# Patient Record
Sex: Male | Born: 1945 | Race: White | Hispanic: No | Marital: Married | Smoking: Current some day smoker
Health system: Southern US, Community
[De-identification: ages and names within clinical notes are randomized; demographics above are authoritative.]

---

## 2005-03-31 ENCOUNTER — Emergency Department: Payer: Self-pay | Admitting: Unknown Physician Specialty

## 2005-03-31 ENCOUNTER — Other Ambulatory Visit: Payer: Self-pay

## 2008-09-03 ENCOUNTER — Ambulatory Visit: Payer: Self-pay | Admitting: Gastroenterology

## 2008-09-03 LAB — HM COLONOSCOPY

## 2011-11-03 ENCOUNTER — Inpatient Hospital Stay: Payer: Self-pay | Admitting: Internal Medicine

## 2011-11-03 LAB — COMPREHENSIVE METABOLIC PANEL
Albumin: 4.4 g/dL (ref 3.4–5.0)
Alkaline Phosphatase: 68 U/L (ref 50–136)
Anion Gap: 9 (ref 7–16)
Chloride: 106 mmol/L (ref 98–107)
Co2: 22 mmol/L (ref 21–32)
Creatinine: 0.94 mg/dL (ref 0.60–1.30)
EGFR (African American): >60
EGFR (Non-African Amer.): >60
Glucose: 131 mg/dL — ABNORMAL HIGH (ref 65–99)
Osmolality: 279 (ref 275–301)
Potassium: 5 mmol/L (ref 3.5–5.1)
SGPT (ALT): 33 U/L
Total Protein: 8.6 g/dL — ABNORMAL HIGH (ref 6.4–8.2)

## 2011-11-03 LAB — APTT
Activated PTT: 39.4 secs — ABNORMAL HIGH (ref 23.6–35.9)
Activated PTT: 111.9 secs — ABNORMAL HIGH (ref 23.6–35.9)

## 2011-11-03 LAB — CK TOTAL AND CKMB (NOT AT ARMC): CK-MB: 1.7 ng/mL (ref 0.5–3.6)

## 2011-11-03 LAB — CBC
MCHC: 32.5 g/dL (ref 32.0–36.0)
MCV: 95 fL (ref 80–100)
Platelet: 248 10*3/uL (ref 150–440)
WBC: 12.7 10*3/uL — ABNORMAL HIGH (ref 3.8–10.6)

## 2011-11-03 LAB — TROPONIN I
Troponin-I: 0.19 ng/mL — ABNORMAL HIGH
Troponin-I: 0.18 ng/mL — ABNORMAL HIGH

## 2011-11-03 LAB — PRO B NATRIURETIC PEPTIDE: B-Type Natriuretic Peptide: 5258 pg/mL — ABNORMAL HIGH (ref 0–125)

## 2011-11-03 LAB — PROTIME-INR: Prothrombin Time: 13.8 secs (ref 11.5–14.7)

## 2011-11-04 LAB — HEMOGLOBIN A1C: Hemoglobin A1C: 5.5 % (ref 4.2–6.3)

## 2011-11-04 LAB — CBC WITH DIFFERENTIAL/PLATELET
Basophil %: 0.4 %
Eosinophil #: 0.4 10*3/uL (ref 0.0–0.7)
Eosinophil %: 3.6 %
HCT: 47.2 % (ref 40.0–52.0)
HGB: 16.3 g/dL (ref 13.0–18.0)
Lymphocyte #: 1.7 10*3/uL (ref 1.0–3.6)
MCH: 31.9 pg (ref 26.0–34.0)
MCHC: 34.5 g/dL (ref 32.0–36.0)
MCV: 92 fL (ref 80–100)
Monocyte %: 10.1 %
Neutrophil %: 69.1 %
Platelet: 187 10*3/uL (ref 150–440)
RDW: 13.2 % (ref 11.5–14.5)
WBC: 9.9 10*3/uL (ref 3.8–10.6)

## 2011-11-04 LAB — BASIC METABOLIC PANEL
Anion Gap: 10 (ref 7–16)
BUN: 21 mg/dL — ABNORMAL HIGH (ref 7–18)
Calcium, Total: 8.8 mg/dL (ref 8.5–10.1)
Chloride: 100 mmol/L (ref 98–107)
Co2: 29 mmol/L (ref 21–32)
Creatinine: 1.06 mg/dL (ref 0.60–1.30)
EGFR (African American): >60
Glucose: 99 mg/dL (ref 65–99)
Osmolality: 281 (ref 275–301)
Sodium: 139 mmol/L (ref 136–145)

## 2011-11-04 LAB — CK TOTAL AND CKMB (NOT AT ARMC)
CK, Total: 49 U/L (ref 35–232)
CK-MB: 2 ng/mL (ref 0.5–3.6)

## 2011-11-04 LAB — MAGNESIUM: Magnesium: 1.5 mg/dL — ABNORMAL LOW

## 2011-11-04 LAB — TROPONIN I: Troponin-I: 0.32 ng/mL — ABNORMAL HIGH

## 2011-11-04 LAB — LIPID PANEL
Cholesterol: 183 mg/dL (ref 0–200)
Triglycerides: 185 mg/dL (ref 0–200)
VLDL Cholesterol, Calc: 37 mg/dL (ref 5–40)

## 2011-11-05 LAB — BASIC METABOLIC PANEL
Calcium, Total: 8.9 mg/dL (ref 8.5–10.1)
Chloride: 102 mmol/L (ref 98–107)
Co2: 27 mmol/L (ref 21–32)
Creatinine: 1.08 mg/dL (ref 0.60–1.30)
EGFR (African American): >60
Glucose: 101 mg/dL — ABNORMAL HIGH (ref 65–99)
Osmolality: 275 (ref 275–301)
Potassium: 3.8 mmol/L (ref 3.5–5.1)

## 2011-11-05 LAB — PRO B NATRIURETIC PEPTIDE: B-Type Natriuretic Peptide: 1121 pg/mL — ABNORMAL HIGH (ref 0–125)

## 2011-11-05 LAB — MAGNESIUM: Magnesium: 1.8 mg/dL

## 2011-12-11 ENCOUNTER — Ambulatory Visit: Payer: Self-pay | Admitting: Internal Medicine

## 2011-12-11 LAB — PROTIME-INR
INR: 2.6
Prothrombin Time: 28.3 secs — ABNORMAL HIGH (ref 11.5–14.7)

## 2012-08-08 ENCOUNTER — Ambulatory Visit: Payer: Self-pay | Admitting: Emergency Medicine

## 2013-06-05 ENCOUNTER — Emergency Department: Payer: Self-pay | Admitting: Emergency Medicine

## 2014-02-03 IMAGING — CR DG CHEST 2V
1 series · 2 of 2 positions shown · non-contrast
Comparison: none

REASON FOR EXAM: shob
COMMENTS:   May transport without cardiac monitor

PROCEDURE:     DXR - DXR CHEST PA (OR AP) AND LATERAL  - November 03, 2011 [DATE]
RESULT:     Comparison: None.

[Series 1: w chest pa · 0.14mm/px · 2 of 2 slices shown]
[im 1/2]
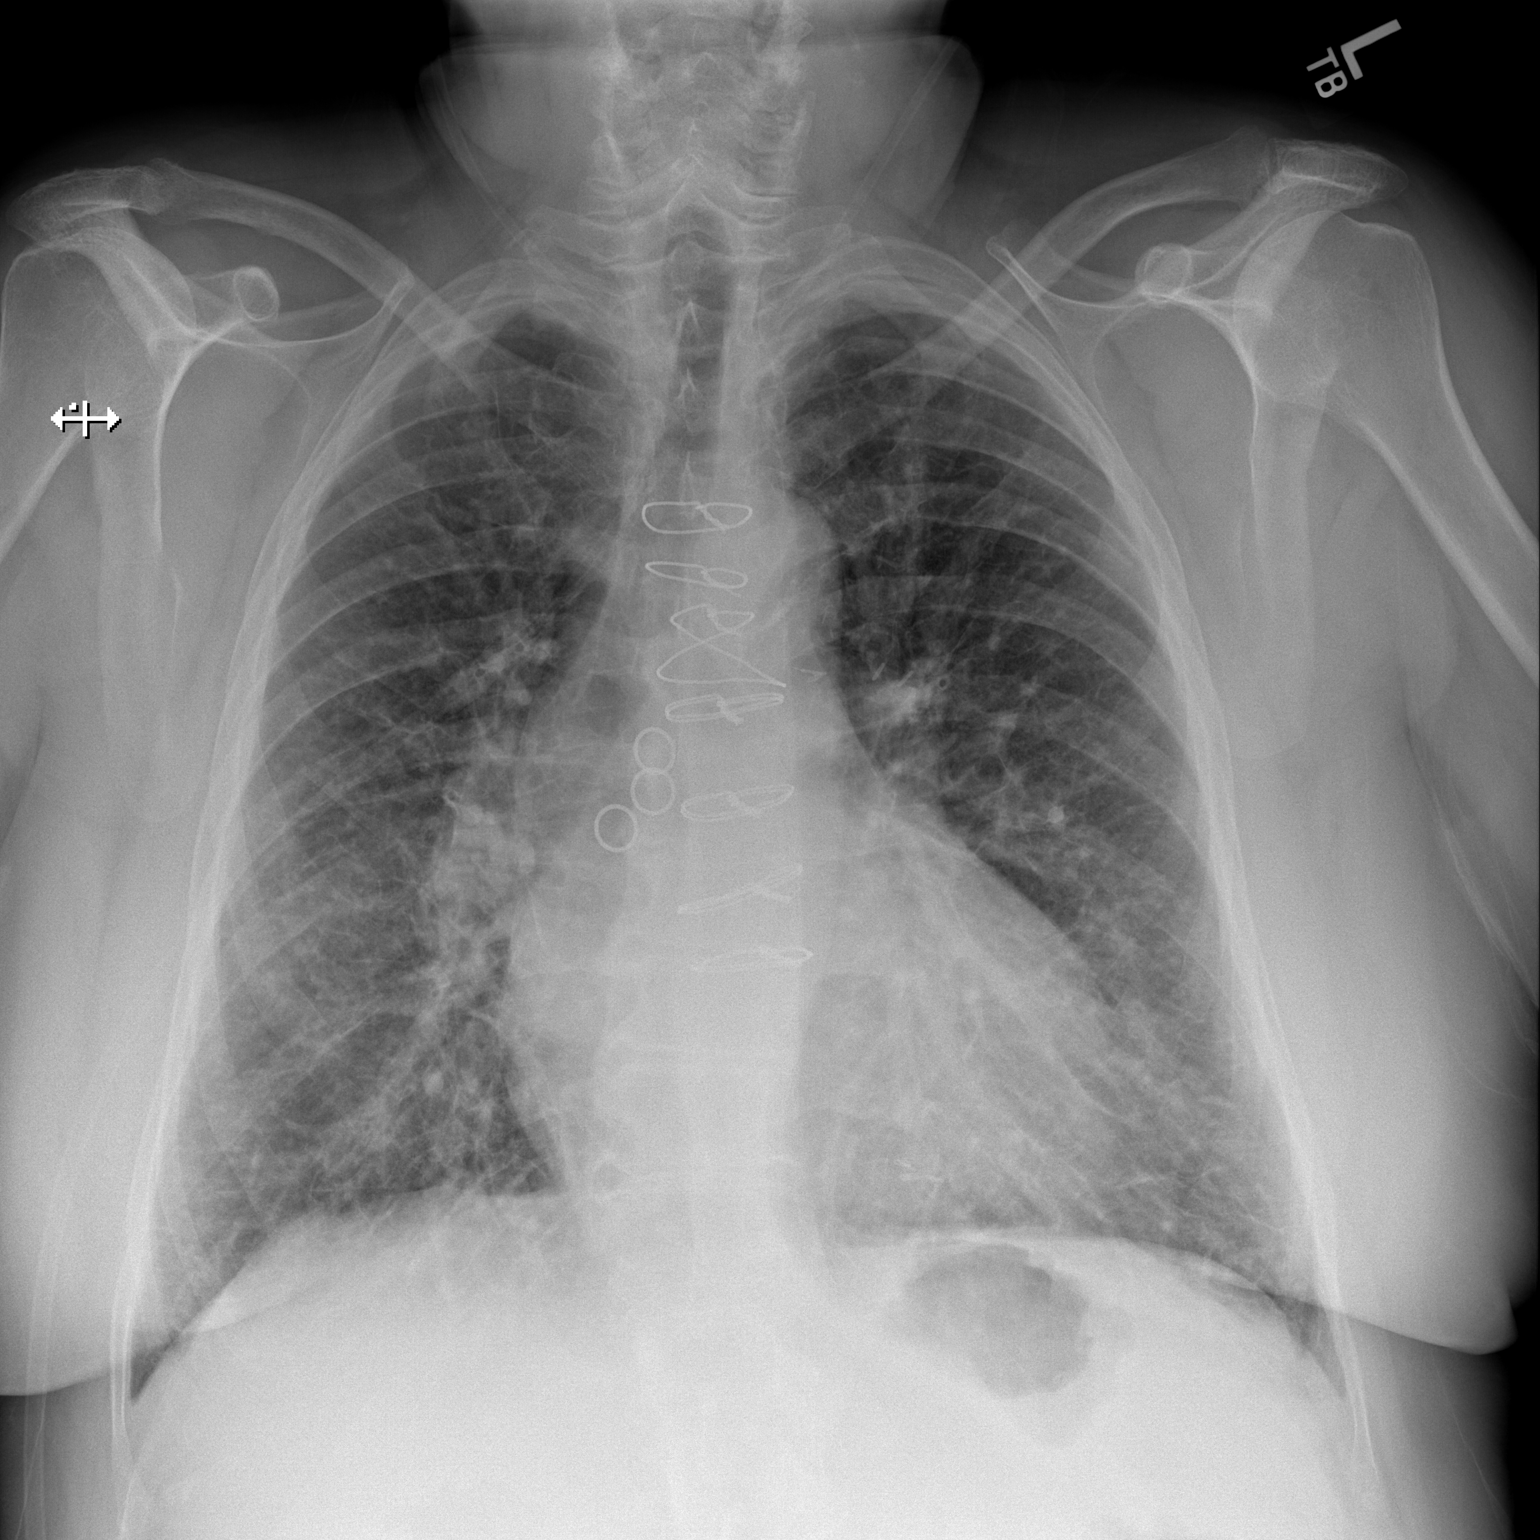
[im 2/2]
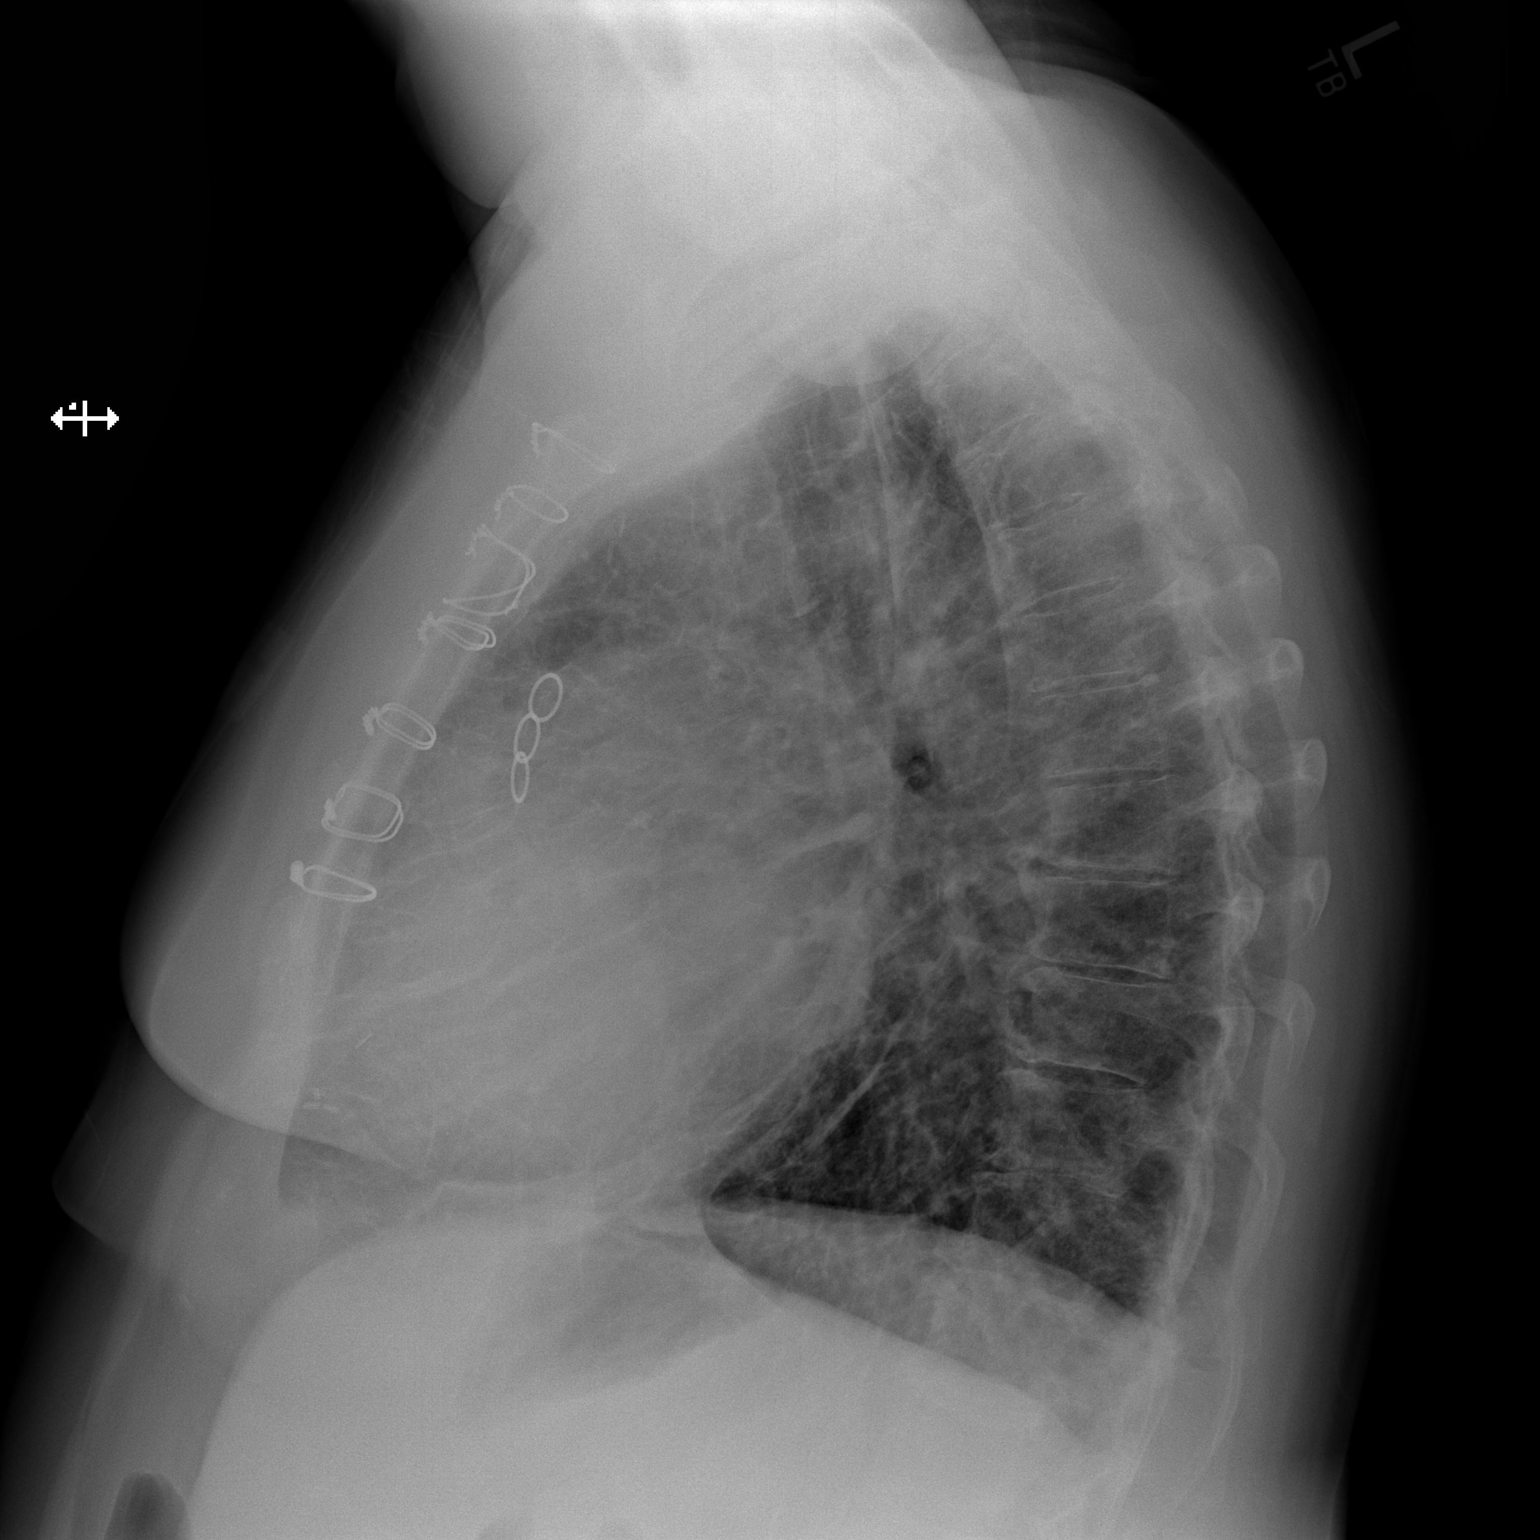

[2 of 2 positions shown; findings below may reference images not displayed]

FINDINGS: Heart size upper limits normal. Prior median sternotomy and CABG. There are
bilateral interstitial pulmonary opacities.
IMPRESSION: Nonspecific interstitial pulmonary opacities. Differential includes
interstitial pulmonary edema and atypical infection.

## 2014-11-01 NOTE — Discharge Summary (Signed)
PATIENT NAME:  Marc Luna, Marc Luna MR#:  [id number] DATE OF BIRTH:  02-Jul-1946  DATE OF ADMISSION:  11/03/2011 DATE OF DISCHARGE:  11/05/2011  ADMITTING DIAGNOSIS: Shortness of breath.   DISCHARGE DIAGNOSES:  1. Shortness of breath likely due to acute systolic congestive heart failure, now his shortness of breath has resolved.  2. Atrial fibrillation, which seems to be new onset. Patient started on Coumadin. He will have his initial INR checked at [facility] cardiology per Dr. Lady Gary and then he can have follow up with Dr. Elizabeth Sauer for further Coumadin management.  3. Intermediately elevated troponin, likely due to congestive heart failure. He will follow up with Dr. Gwen Pounds to decide whether he needs further cardiac catheterization for further evaluation. Patient has no chest pain.  4. Possible chronic obstructive pulmonary disease with wheezing noted on presentation. No further symptoms. Patient will need outpatient follow up with pulmonary and possible pulmonary function tests. Will continue inhalers as was taking at home.  5. Leukocytosis present on admission, possibly reactive. Patient has been afebrile.  6. Hyperglycemia, again reactive. His hemoglobin A1c is 5.7.  7. Hyperlipidemia.  8. Hypertension.  9. Nicotine addiction. Patient counseled by the admitting physician regarding importance of smoking.  10. History of depression.  11. Status post coronary artery bypass graft.   LABORATORY, DIAGNOSTIC AND RADIOLOGICAL DATA: BNP 5258. CPK 50, troponin 0.19, WBC 12.7, hemoglobin 18, hematocrit 55.2, platelet count 248, glucose 131, BUN 23. Chest x-ray showed nonspecific interstitial pulmonary opacities with concurrent pulmonary edema. Blood cultures x2 no growth at 36 hours. His troponin did increase to 0.32. Fasting lipid panel: Cholesterol 183, triglycerides 185, HDL 37, LDL 109. Hemoglobin A1c 5.5. Magnesium 1.5. WBC count on 04/27 was 9.9. Echocardiogram done 04/27 showed ejection  fraction less than 25%, moderate to severe global hypokinesis, right atrium is mildly enlarged, mild mitral regurgitation. Patient's creatinine today was 1.08.   CONSULTANT: Dr. Lady Gary.   HOSPITAL COURSE: Please refer to history and physical done by the admitting physician. Patient is a 69 year old white male with past medical history of coronary artery disease, status post CABG, depression, hypertension, hyperlipidemia, ongoing smoking who was in his usual state of health until the morning of admission when he started becoming very short of breath. He was having some dry cough for the past few days. Patient came to the ED and found to be in respiratory distress, had to be placed on oxygen. Patient also was wheezing initially. He was given IV Lasix with improvement in his symptoms. We were asked to admit the patient. Patient was diagnosed with acute congestive heart failure. An echocardiogram was obtained which confirmed that he had severe systolic dysfunction. Patient was kept on diuresis with significant improvement in his shortness of breath. He is currently not short of breath and is not requiring any O2. Patient was also seen in consultation by cardiology. They recommended continuing current management. Patient also was noted to be in atrial fibrillation which is likely new. His [place] score was 2 based on his hypertension and his congestive heart failure. Patient was given option of Pradaxa and Coumadin. He chose to do Coumadin due to cost issues. Initially he was heparinized. Patient also was noted to have elevated troponin which was intermediate due to decrease in his ejection fraction compared to 2008. Dr. Lady Gary will be talking to Dr. Gwen Pounds regarding further evaluation for ischemia. He does have a history of coronary artery bypass graft in the past. Patient also on presentation was noted to have some  wheezing. He was started on some inhalers and his wheezing has since resolved. Patient with his smoking  history does need to be referred to pulmonary for further evaluation and PFTs to make sure he does not have any lung disease. Currently he has been cleared by cardiology to be discharged home.   DISCHARGE INSTRUCTIONS: He is to weigh himself every day, preferably first thing in the morning after urinating and before eating. Call physician if you gain of more than 2 pounds in one day or more than 5 pounds in one week, for increased weight gain, tiredness, swelling in legs or feet or chest pressure call 911. Echocardiogram done during this hospitalization.   DISCHARGE MEDICATIONS:  1. Lisinopril 20 daily.   2. Metoprolol tartrate 100 mg, 1 tab p.o. b.i.d.  3. Trilipix 135 p.o. daily.  4. Pravastatin 80 mg daily.  5. Zoloft 100 daily.  6. Niaspan ER 500 daily.  7. Aspirin 81 mg 1 tab p.o. daily.  8. Coumadin 4 mg daily.  9. Symbicort 160/4.5 INH q.12.  10. Combivent metered-dose inhaler 2 to 4 puffs q.6 p.r.n. shortness of breath or wheezing.  11. Lasix 20 daily.   DIET: Low sodium, low fat.   ACTIVITY: As tolerated.   TIMEFRAME FOR FOLLOW UP: 1 to 2 weeks with Dr. Elizabeth Sauereanna Jones, Dr. Gwen PoundsKowalski this coming week. Patient to have his [facility] in [facility] per Dr. Lady GaryFath then with Dr. Elizabeth Sauereanna Jones for chronic anticoagulation due to his atrial fibrillation. Patient is also referred to Dr. Meredeth IdeFleming as a new patient for pulmonary evaluation for possible outpatient PFTs.   TIME SPENT: 35 minutes.   ____________________________ Lacie ScottsShreyang H. Allena KatzPatel, MD shp:cms D: 11/05/2011 11:51:52 ET T: 11/06/2011 11:34:07 ET JOB#: [id number]  cc: Ubah Radke H. Allena KatzPatel, MD, <Dictator> Duanne Limerickeanna C. Jones, MD Charise CarwinSHREYANG H Jaccob Czaplicki MD ELECTRONICALLY SIGNED 11/06/2011 13:17

## 2014-11-01 NOTE — H&P (Signed)
PATIENT NAME:  Marc Luna, Marc Luna MR#:  [id number] DATE OF BIRTH:  12/03/1945  DATE OF ADMISSION:  11/03/2011  REFERRING PHYSICIAN: ER physician, Dr. Mayford Knife  PRIMARY CARE PHYSICIAN: Dr. Elizabeth Sauer CARDIOLOGIST: Dr. Gwen Pounds  CHIEF COMPLAINT: Shortness of breath.   HISTORY OF PRESENT ILLNESS: Patient is a 69 year old male with past medical history of coronary artery disease, status post CABG, depression, hypertension, hyperlipidemia with ongoing smoking who was in his usual state of health until this morning when he became very short of breath. He has been having some cough without any expectoration for a few days. When he came to the ER he was found to be in some respiratory distress, had to be placed on oxygen. He was also wheezing. He was given some Lasix with improvement in his symptoms. Patient reports that he has not had any recent echo or stress test. He denies any fever, chest pain, lightheadedness, dizziness, leg swelling. He reports that he usually gains weight in the winter. Denies any sick contacts. He denies ever having history of irregular heart rhythm or atrial fibrillation. He was found to be in rate control atrial fibrillation.   ALLERGIES: Penicillin.   CURRENT MEDICATIONS:  1. Aspirin 81 mg b.i.d.  2. Lisinopril 20 mg daily.  3. Lopressor 100 mg b.i.d.  4. Niaspan 500 mg daily.  5. Pravachol 80 mg daily.  6. Trilipix 135 mg daily.  7. Zoloft 100 mg daily.   PAST MEDICAL HISTORY:  1. Coronary artery disease status post CABG. 2. Depression. 3. Hypertension. 4. Hyperlipidemia.   PAST SURGICAL HISTORY: Coronary artery bypass graft.   SOCIAL HISTORY: Smokes 1 pack per day. Denies any drug abuse. Drinks two beers at night, 8 to 9 beers on Saturday and 3 to 4 beers, 12 ounces, on Sunday. Denies ever having alcohol withdrawal. He is married and lives with his wife.   FAMILY HISTORY: Mother and mother's side of the family had heart disease. Father's side of the family had  cancer.    REVIEW OF SYSTEMS: CONSTITUTIONAL: Denies any fever, fatigue, weakness. EYES: Denies any blurred or double vision. ENT: Denies any tinnitus, ear pain. RESPIRATORY: Reports cough, dyspnea. CARDIOVASCULAR: Denies any chest pain, palpitations, syncope. GASTROINTESTINAL: Denies any nausea, vomiting, diarrhea, abdominal pain. GENITOURINARY: Denies any dysuria, hematuria. ENDOCRINE: Denies any polyuria, nocturia. HEME/LYMPH: Denies any anemia, easy bruisability. INTEGUMENTARY: Denies any acne, rash. MUSCULOSKELETAL: Denies any swelling, gout. NEUROLOGICAL: Denies any numbness, weakness. PSYCH: Denies any anxiety, nervousness.   PHYSICAL EXAMINATION:  VITAL SIGNS: Temperature 94.8, heart rate 92, respiratory rate 36, blood pressure 168/88, pulse oximetry 90%.   HEAD: Atraumatic, normocephalic.   EYES: There is no pallor, icterus, or cyanosis. Pupils are equal, round, and reactive to light and accommodation. Extraocular movements intact.   ENT: Wet mucous membranes. No oropharyngeal erythema or thrush.   NECK: Supple. No masses. No JVD. No thyromegaly. No lymphadenopathy.   CHEST WALL: No tenderness to palpation. Not using accessory muscles of respiration. No intercostal muscle retractions.   LUNGS: Bilateral basal crepitations. Patient had some wheezing initially which is now resolved.   CARDIOVASCULAR: S1, S2 irregularly irregular. There is a systolic murmur. No rubs or gallops.   ABDOMEN: Soft, nontender, nondistended. No guarding. No rigidity. Normal bowel sounds.   SKIN: No rashes, lesions.   PERIPHERIES: Trace pedal edema. 2+ pedal pulses.   MUSCULOSKELETAL: No cyanosis, clubbing.   NEUROLOGICAL: Awake, alert, oriented x3. Nonfocal neurological exam. Cranial nerves grossly intact.   PSYCH: Normal mood and affect.   LABORATORY,  DIAGNOSTIC AND RADIOLOGICAL DATA: BNP 5258. CK 50, troponin 0.19, white count 12.7, hemoglobin 18, hematocrit 55.2, platelets 248, glucose 131, BUN  23. Chest x-ray shows nonspecific interstitial pulmonary opacities. Additionally showed pulmonary edema versus atypical infection.   ASSESSMENT AND PLAN: 69 year old male with past medical history of coronary artery disease, depression, hypertension, hyperlipidemia presents with shortness of breath,  1. Possible congestive heart failure. Patient's chest x-ray shows pulmonary vascular congestion. His BNP is elevated at 5258. He had symptomatic relief with Lasix. Will get an echo to determine whether it is systolic or diastolic. Will continue IV Lasix with potassium supplementation to prevent hypokalemia.  2. Atrial fibrillation, possibly new onset. Patient denies having history of atrial fibrillation in the past. It is rate controlled possibly because he was on Lopressor. Will continue patient's beta blocker. He has a high [place] score and elevated troponin therefore will start him on anticoagulation with heparin. He will probably require oral anticoagulant at the time of discharge.  3. Elevated troponin. Differential diagnoses includes NSTEMI versus demand ischemia from atrial fibrillation and congestive heart failure. Patient denies any chest pain. Will check serial cardiac enzymes. Will continue with aspirin, beta blocker, ACE inhibitor, statin. Place on p.r.n. nitro paste and Lovenox. Will obtain a cardiology consultation and echo.  4. Possible pneumonitis. Chest x-ray shows possible pneumonitis/atypical infection. Patient has leukocytosis. Will obtain blood cultures and empirically treat with antibiotics.  5. Wheezing. Patient is a smoker. Denies any history of chronic obstructive pulmonary disease or bronchitis but was wheezing on admission. He may require outpatient PFTs. Will start on Symbicort, DuoNebs and Tussionex.  6. Hyperglycemia, possibly reactive. Will check a hemoglobin A1c.   7. Smoking. Patient has been counseled about cessation for more than three minutes. Will provide with a nicotine  patch. 8. Hyperlipidemia. Will continue current medications. Check a fasting lipid profile.  9. Hypertension. Appears to be stable at present.   Reviewed old medical records, discussed with the patient and his wife the plan of care and management.   TIME SPENT: 75 minutes.   ____________________________ Darrick MeigsSangeeta Deniese Oberry, MD sp:cms D: 11/03/2011 15:20:26 ET T: 11/03/2011 16:05:19 ET JOB#: [id number]  cc: Darrick MeigsSangeeta Amire Leazer, MD, <Dictator> Duanne Limerickeanna C. Jones, MD Darrick MeigsSANGEETA Ezrah Panning MD ELECTRONICALLY SIGNED 11/03/2011 17:22

## 2014-11-01 NOTE — Consult Note (Signed)
General Aspect patient is a 69 year old male with history of coronary artery disease status post coronary artery bypass grafting approximately 2007.  He is followed by Dr. Arnoldo HookerBruce Kowalski.  On day of admission he awoke complaining of severe shortness of breath.  He was brought to emergency room where he was noted to have probable mild pulmonary edema.  He states that prior to his coronary artery bypass grafting, he had similar feelings.  He underwent cardiac catheterization after ruling in for myocardial infarction and subsequently underwent coronary artery bypass grafting.  His initial serum troponin was unremarkable.  Echocardiogram done today reveals moderate-severely reduced left ventricular function with ejection fraction less than 25%.he is improved with diuresis and oxygen.  He currently denies any chest pain.   Physical Exam:   GEN no acute distress, obese    HEENT PERRL    NECK supple    RESP no use of accessory muscles  crackles    CARD Regular rate and rhythm  Murmur    Murmur Systolic    Systolic Murmur axilla    ABD denies tenderness  normal BS    LYMPH negative neck    EXTR negative cyanosis/clubbing, negative edema    SKIN normal to palpation    NEURO cranial nerves intact, motor/sensory function intact    PSYCH A+O to time, place, person   Review of Systems:   Subjective/Chief Complaint shortness of breath    General: Fatigue  Weakness    Skin: No Complaints    ENT: No Complaints    Eyes: No Complaints    Neck: No Complaints    Respiratory: Short of breath    Cardiovascular: No Complaints    Gastrointestinal: No Complaints    Genitourinary: No Complaints    Vascular: No Complaints    Musculoskeletal: No Complaints    Neurologic: No Complaints    Hematologic: No Complaints    Endocrine: No Complaints    Psychiatric: No Complaints    Review of Systems: All other systems were reviewed and found to be negative    Medications/Allergies  Reviewed Medications/Allergies reviewed     Myocardial Infarct:    Depression:    Hypertension:    CABG (Coronary Artery Bypass Graft):   Home Medications: Medication Instructions Status  lisinopril 20 mg oral tablet 1 tab(s) orally once a day Active  metoprolol tartrate 100 mg oral tablet 1 tab(s) orally 2 times a day Active  Trilipix 135 mg oral delayed release capsule 1 cap(s) orally once a day Active  pravastatin 80 mg oral tablet 1 tab(s) orally once a day (at bedtime) Active  Zoloft 100 mg oral tablet 1 tab(s) orally once a day Active  Niaspan ER 500 mg oral tablet, extended release 1 tab(s) orally once a day (at bedtime) Active  aspirin 81 mg oral tablet 1 tab(s) orally 2 times a day Active   EKG:   EKG NSR    Penicillin: Swelling, Rash    Impression 69 year old male with history of coronary artery disease status post coronary artery bypass grafting now admitted with rest shortness of breath similar to his angina.  He is thus for rule out for myocardial infarction with a minimal troponin elevation however has improved with diuresis.  Etiology of his decompensation is unclear as his ejection fraction is significantly reduced.  We'll need to discuss this with his primary cardiologist regarding further treatment however given his progressive symptoms, consideration for further ischemic evaluation is warranted.    Plan 1.  Continue  with current medications including gentle diuresis 2.  Weight loss 3.  Continue to rule out for myocardial infarction 4.  Smoking cessation was discussed and nicotine patch ordered 5.  Further recommendations pending course   Electronic Signatures: Dalia Heading (MD)  (Signed 27-Apr-13 16:15)  Authored: General Aspect/Present Illness, History and Physical Exam, Review of System, Past Medical History, Home Medications, EKG , Allergies, Impression/Plan   Last Updated: 27-Apr-13 16:15 by Dalia Heading (MD)

## 2014-11-06 DIAGNOSIS — I480 Paroxysmal atrial fibrillation: Secondary | ICD-10-CM | POA: Insufficient documentation

## 2014-11-06 DIAGNOSIS — K219 Gastro-esophageal reflux disease without esophagitis: Secondary | ICD-10-CM | POA: Insufficient documentation

## 2014-11-06 DIAGNOSIS — E669 Obesity, unspecified: Secondary | ICD-10-CM | POA: Insufficient documentation

## 2014-11-06 DIAGNOSIS — F339 Major depressive disorder, recurrent, unspecified: Secondary | ICD-10-CM | POA: Insufficient documentation

## 2014-11-06 DIAGNOSIS — Z0000 Encounter for general adult medical examination without abnormal findings: Secondary | ICD-10-CM | POA: Insufficient documentation

## 2014-11-06 DIAGNOSIS — I10 Essential (primary) hypertension: Secondary | ICD-10-CM | POA: Insufficient documentation

## 2014-11-06 DIAGNOSIS — Z1331 Encounter for screening for depression: Secondary | ICD-10-CM | POA: Insufficient documentation

## 2014-11-06 DIAGNOSIS — J449 Chronic obstructive pulmonary disease, unspecified: Secondary | ICD-10-CM | POA: Insufficient documentation

## 2014-11-06 DIAGNOSIS — I429 Cardiomyopathy, unspecified: Secondary | ICD-10-CM | POA: Insufficient documentation

## 2014-11-06 DIAGNOSIS — I25810 Atherosclerosis of coronary artery bypass graft(s) without angina pectoris: Secondary | ICD-10-CM | POA: Insufficient documentation

## 2014-11-06 DIAGNOSIS — F17200 Nicotine dependence, unspecified, uncomplicated: Secondary | ICD-10-CM | POA: Insufficient documentation

## 2014-11-06 DIAGNOSIS — E7849 Other hyperlipidemia: Secondary | ICD-10-CM | POA: Insufficient documentation

## 2014-11-09 IMAGING — CR DG CHEST 2V
1 series · 2 of 2 positions shown · non-contrast
Comparison: none

REASON FOR EXAM: Cough and rhonchi and wheezing bilateral
COMMENTS:

[Series 1: pa · 0.17mm/px · 2 of 2 slices shown]
[im 1/2]
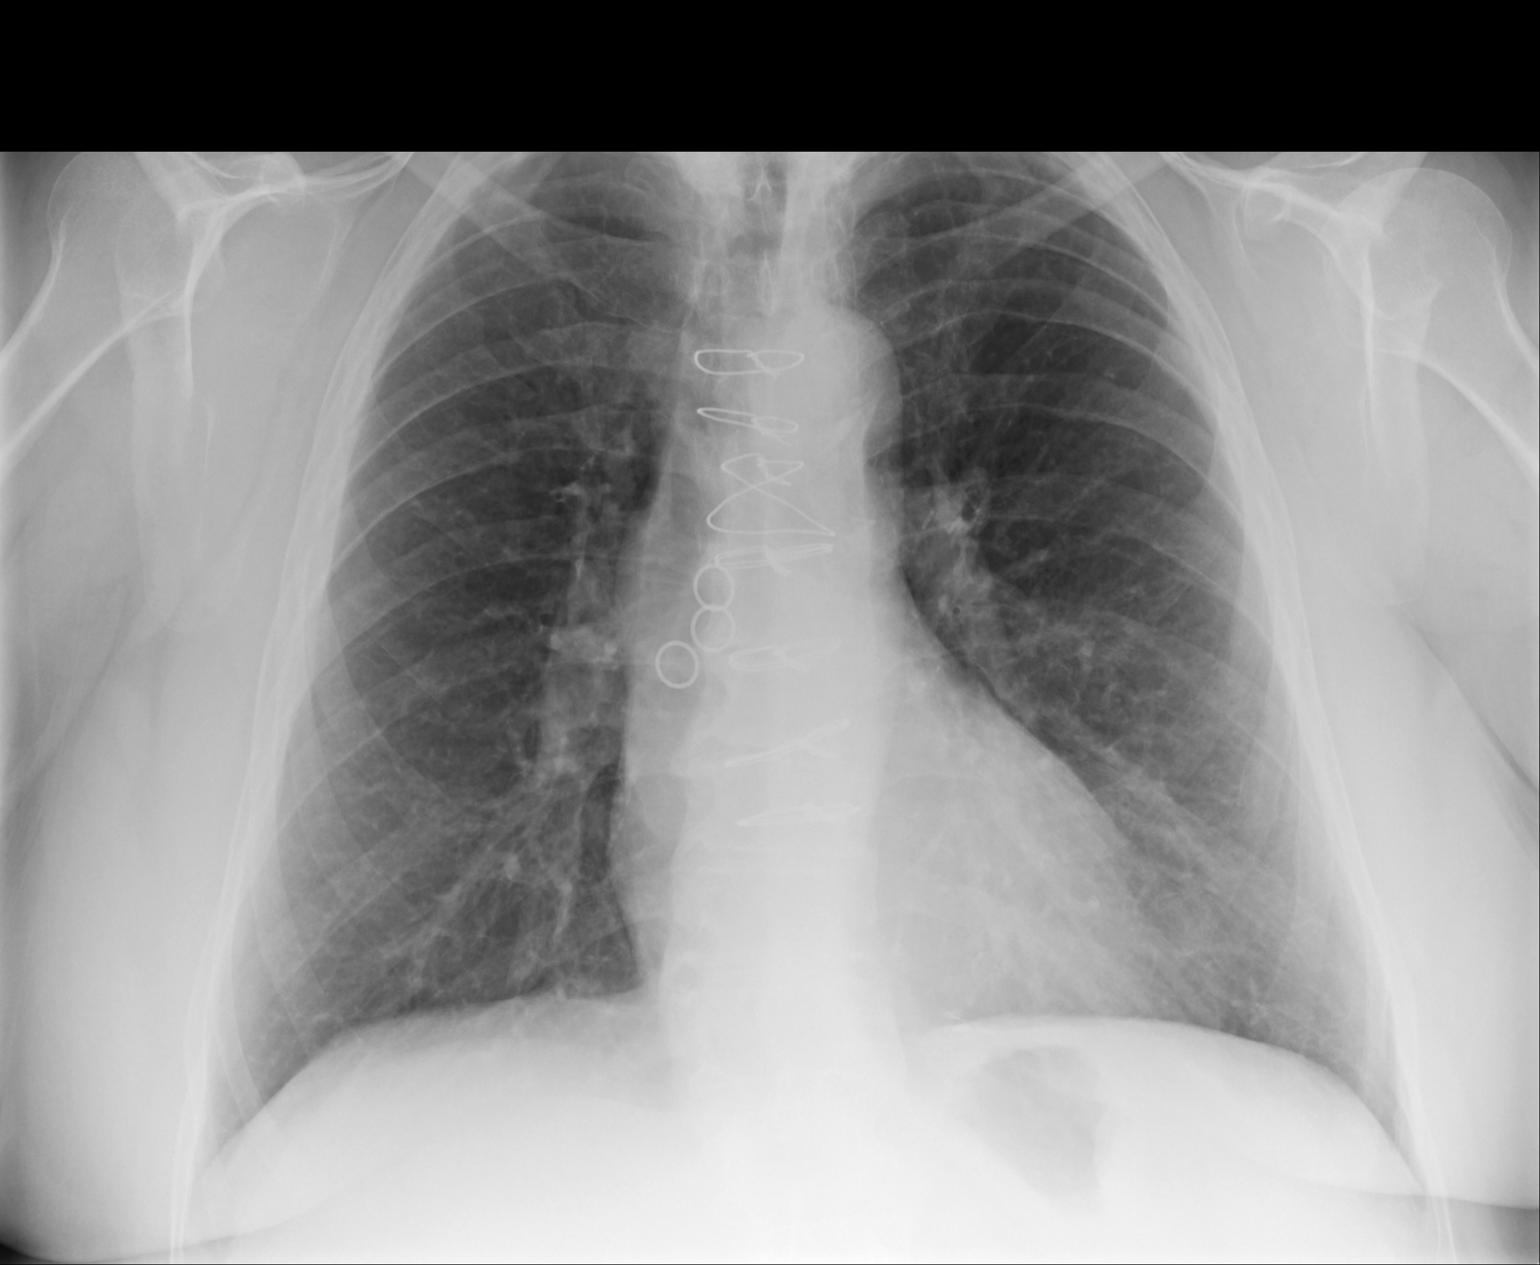
[im 2/2]
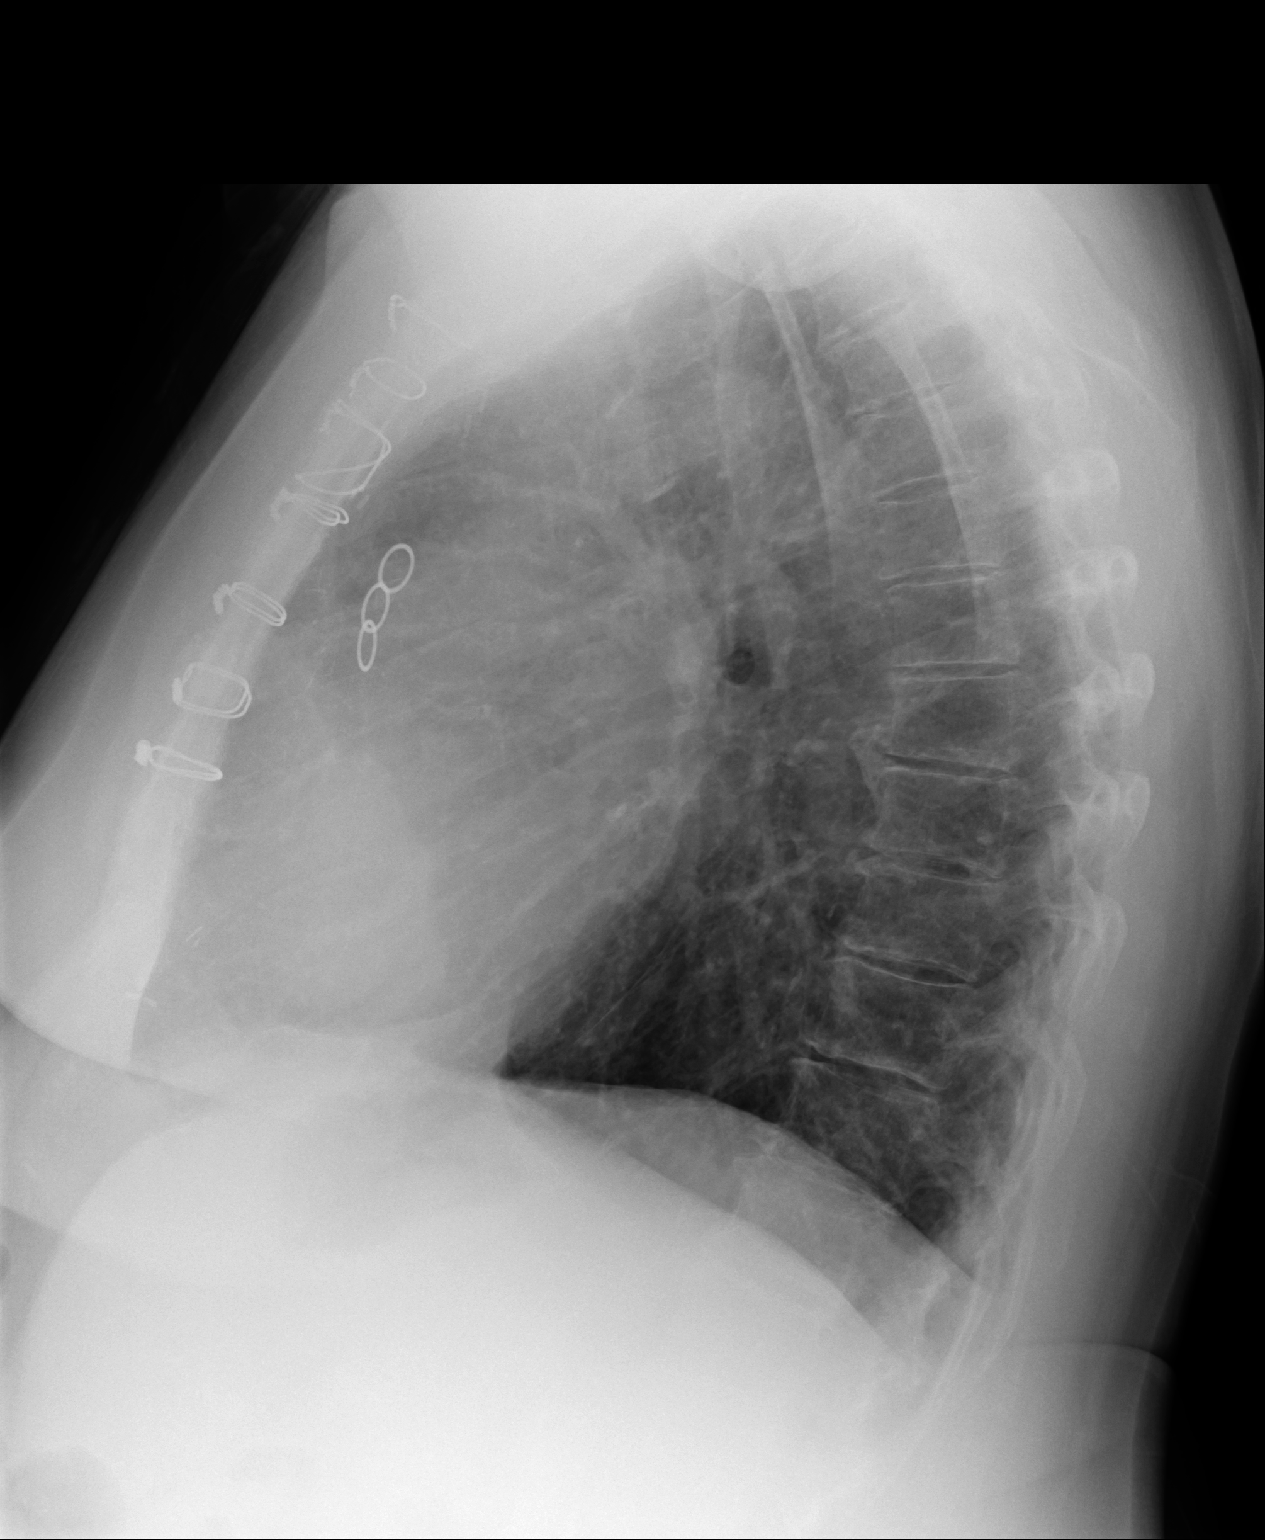

[2 of 2 positions shown; findings below may reference images not displayed]

PROCEDURE:     MDR - MDR CHEST PA(OR AP) AND LATERAL  - August 08, 2012  [DATE]

RESULT:     Comparison is made to the study November 03, 2011.

The lungs are mildly hyperinflated. There is no focal infiltrate. The
cardiac silhouette is normal in size. The pulmonary vascularity is not
engorged. The interstitial markings are mildly prominent but not as
conspicuous as on the previous study. The patient has undergone previous
CABG.
IMPRESSION: The findings are consistent with COPD and likely an element
of underlying pulmonary fibrosis. I cannot exclude acute bronchitis in the
appropriate clinical setting.

[REDACTED]

## 2015-01-01 ENCOUNTER — Other Ambulatory Visit: Payer: Self-pay | Admitting: Family Medicine

## 2015-01-01 DIAGNOSIS — I10 Essential (primary) hypertension: Secondary | ICD-10-CM

## 2015-01-07 ENCOUNTER — Other Ambulatory Visit: Payer: Self-pay

## 2015-01-07 DIAGNOSIS — I10 Essential (primary) hypertension: Secondary | ICD-10-CM

## 2015-01-07 MED ORDER — METOPROLOL TARTRATE 50 MG PO TABS: 50.0000 mg | ORAL_TABLET | Freq: Two times a day (BID) | ORAL | Status: DC

## 2015-06-11 ENCOUNTER — Other Ambulatory Visit: Payer: Self-pay | Admitting: Family Medicine

## 2015-06-23 ENCOUNTER — Other Ambulatory Visit: Payer: Self-pay | Admitting: Family Medicine

## 2015-08-09 ENCOUNTER — Other Ambulatory Visit: Payer: Self-pay

## 2015-08-09 DIAGNOSIS — R609 Edema, unspecified: Secondary | ICD-10-CM

## 2015-08-09 DIAGNOSIS — I10 Essential (primary) hypertension: Secondary | ICD-10-CM

## 2015-08-09 DIAGNOSIS — F329 Major depressive disorder, single episode, unspecified: Secondary | ICD-10-CM

## 2015-08-09 DIAGNOSIS — F32A Depression, unspecified: Secondary | ICD-10-CM

## 2015-08-09 MED ORDER — FUROSEMIDE 20 MG PO TABS: 20.0000 mg | ORAL_TABLET | Freq: Every day | ORAL | Status: AC

## 2015-08-09 MED ORDER — SERTRALINE HCL 100 MG PO TABS: 100.0000 mg | ORAL_TABLET | Freq: Every day | ORAL | Status: AC

## 2015-08-09 MED ORDER — LISINOPRIL 20 MG PO TABS: 20.0000 mg | ORAL_TABLET | Freq: Two times a day (BID) | ORAL | Status: AC

## 2015-08-09 MED ORDER — METOPROLOL TARTRATE 50 MG PO TABS: 50.0000 mg | ORAL_TABLET | Freq: Two times a day (BID) | ORAL | Status: AC

## 2018-07-10 DEATH — deceased
